# Patient Record
Sex: Male | Born: 1995 | Race: White | Marital: Single | Smoking: Never smoker
Health system: Southern US, Community
[De-identification: ages and names within clinical notes are randomized; demographics above are authoritative.]

---
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
## 2016-03-03 DIAGNOSIS — M25511 Pain in right shoulder: Secondary | ICD-10-CM | POA: Insufficient documentation

## 2016-03-28 HISTORY — PX: SHOULDER ARTHROSCOPY: SHX128

## 2016-07-22 DIAGNOSIS — M7521 Bicipital tendinitis, right shoulder: Secondary | ICD-10-CM | POA: Diagnosis not present

## 2016-07-22 DIAGNOSIS — S43431D Superior glenoid labrum lesion of right shoulder, subsequent encounter: Secondary | ICD-10-CM | POA: Diagnosis not present

## 2016-07-22 DIAGNOSIS — R531 Weakness: Secondary | ICD-10-CM | POA: Diagnosis not present

## 2016-07-22 DIAGNOSIS — M25611 Stiffness of right shoulder, not elsewhere classified: Secondary | ICD-10-CM | POA: Diagnosis not present

## 2016-07-27 DIAGNOSIS — R531 Weakness: Secondary | ICD-10-CM | POA: Diagnosis not present

## 2016-07-27 DIAGNOSIS — M7521 Bicipital tendinitis, right shoulder: Secondary | ICD-10-CM | POA: Diagnosis not present

## 2016-07-27 DIAGNOSIS — S43431D Superior glenoid labrum lesion of right shoulder, subsequent encounter: Secondary | ICD-10-CM | POA: Diagnosis not present

## 2016-07-27 DIAGNOSIS — M25611 Stiffness of right shoulder, not elsewhere classified: Secondary | ICD-10-CM | POA: Diagnosis not present

## 2016-08-17 DIAGNOSIS — M25611 Stiffness of right shoulder, not elsewhere classified: Secondary | ICD-10-CM | POA: Diagnosis not present

## 2016-08-17 DIAGNOSIS — S43431D Superior glenoid labrum lesion of right shoulder, subsequent encounter: Secondary | ICD-10-CM | POA: Diagnosis not present

## 2016-08-17 DIAGNOSIS — R531 Weakness: Secondary | ICD-10-CM | POA: Diagnosis not present

## 2016-08-17 DIAGNOSIS — M7521 Bicipital tendinitis, right shoulder: Secondary | ICD-10-CM | POA: Diagnosis not present

## 2016-08-18 DIAGNOSIS — M25611 Stiffness of right shoulder, not elsewhere classified: Secondary | ICD-10-CM | POA: Diagnosis not present

## 2016-08-18 DIAGNOSIS — S43431D Superior glenoid labrum lesion of right shoulder, subsequent encounter: Secondary | ICD-10-CM | POA: Diagnosis not present

## 2016-08-18 DIAGNOSIS — M7521 Bicipital tendinitis, right shoulder: Secondary | ICD-10-CM | POA: Diagnosis not present

## 2016-08-18 DIAGNOSIS — R531 Weakness: Secondary | ICD-10-CM | POA: Diagnosis not present

## 2016-09-15 DIAGNOSIS — M7521 Bicipital tendinitis, right shoulder: Secondary | ICD-10-CM | POA: Diagnosis not present

## 2016-09-15 DIAGNOSIS — R531 Weakness: Secondary | ICD-10-CM | POA: Diagnosis not present

## 2016-09-15 DIAGNOSIS — M25611 Stiffness of right shoulder, not elsewhere classified: Secondary | ICD-10-CM | POA: Diagnosis not present

## 2016-09-15 DIAGNOSIS — S43431D Superior glenoid labrum lesion of right shoulder, subsequent encounter: Secondary | ICD-10-CM | POA: Diagnosis not present

## 2016-10-06 DIAGNOSIS — S43431D Superior glenoid labrum lesion of right shoulder, subsequent encounter: Secondary | ICD-10-CM | POA: Diagnosis not present

## 2016-10-06 DIAGNOSIS — M7521 Bicipital tendinitis, right shoulder: Secondary | ICD-10-CM | POA: Diagnosis not present

## 2016-10-06 DIAGNOSIS — R531 Weakness: Secondary | ICD-10-CM | POA: Diagnosis not present

## 2016-10-06 DIAGNOSIS — M25611 Stiffness of right shoulder, not elsewhere classified: Secondary | ICD-10-CM | POA: Diagnosis not present

## 2016-10-11 DIAGNOSIS — M7521 Bicipital tendinitis, right shoulder: Secondary | ICD-10-CM | POA: Diagnosis not present

## 2016-10-11 DIAGNOSIS — M25611 Stiffness of right shoulder, not elsewhere classified: Secondary | ICD-10-CM | POA: Diagnosis not present

## 2016-10-11 DIAGNOSIS — S43431D Superior glenoid labrum lesion of right shoulder, subsequent encounter: Secondary | ICD-10-CM | POA: Diagnosis not present

## 2016-10-11 DIAGNOSIS — R531 Weakness: Secondary | ICD-10-CM | POA: Diagnosis not present

## 2016-10-25 DIAGNOSIS — R531 Weakness: Secondary | ICD-10-CM | POA: Diagnosis not present

## 2016-10-25 DIAGNOSIS — S43431D Superior glenoid labrum lesion of right shoulder, subsequent encounter: Secondary | ICD-10-CM | POA: Diagnosis not present

## 2016-10-25 DIAGNOSIS — M7521 Bicipital tendinitis, right shoulder: Secondary | ICD-10-CM | POA: Diagnosis not present

## 2016-10-25 DIAGNOSIS — M25611 Stiffness of right shoulder, not elsewhere classified: Secondary | ICD-10-CM | POA: Diagnosis not present

## 2016-10-27 DIAGNOSIS — R531 Weakness: Secondary | ICD-10-CM | POA: Diagnosis not present

## 2016-10-27 DIAGNOSIS — M7521 Bicipital tendinitis, right shoulder: Secondary | ICD-10-CM | POA: Diagnosis not present

## 2016-10-27 DIAGNOSIS — S43431D Superior glenoid labrum lesion of right shoulder, subsequent encounter: Secondary | ICD-10-CM | POA: Diagnosis not present

## 2016-10-27 DIAGNOSIS — M25611 Stiffness of right shoulder, not elsewhere classified: Secondary | ICD-10-CM | POA: Diagnosis not present

## 2016-11-01 DIAGNOSIS — R531 Weakness: Secondary | ICD-10-CM | POA: Diagnosis not present

## 2016-11-01 DIAGNOSIS — M25611 Stiffness of right shoulder, not elsewhere classified: Secondary | ICD-10-CM | POA: Diagnosis not present

## 2016-11-01 DIAGNOSIS — M7521 Bicipital tendinitis, right shoulder: Secondary | ICD-10-CM | POA: Diagnosis not present

## 2016-11-01 DIAGNOSIS — S43431D Superior glenoid labrum lesion of right shoulder, subsequent encounter: Secondary | ICD-10-CM | POA: Diagnosis not present

## 2016-11-02 ENCOUNTER — Ambulatory Visit: Payer: Self-pay

## 2016-11-02 ENCOUNTER — Ambulatory Visit (INDEPENDENT_AMBULATORY_CARE_PROVIDER_SITE_OTHER): Payer: BLUE CROSS/BLUE SHIELD | Admitting: Sports Medicine

## 2016-11-02 ENCOUNTER — Ambulatory Visit: Payer: Self-pay | Admitting: Sports Medicine

## 2016-11-02 VITALS — Ht 72.0 in | Wt 189.4 lb

## 2016-11-02 DIAGNOSIS — M79661 Pain in right lower leg: Secondary | ICD-10-CM | POA: Diagnosis not present

## 2016-11-02 DIAGNOSIS — M79662 Pain in left lower leg: Secondary | ICD-10-CM | POA: Diagnosis not present

## 2016-11-02 DIAGNOSIS — M79604 Pain in right leg: Secondary | ICD-10-CM | POA: Diagnosis not present

## 2016-11-02 DIAGNOSIS — M79605 Pain in left leg: Secondary | ICD-10-CM

## 2016-11-02 NOTE — Progress Notes (Signed)
OFFICE VISIT NOTE Brent Preston. Brent Preston [facility] at [facility] [phone]  Brent Preston - 21 y.o. male MRN [id number]  Date of birth: 10/24/1995  Visit Date: 11/02/2016  PCP: No primary care provider on file.   Referred by: No ref. provider found  Brent Preston. [name], [name] for Dr. Berline Chough.   SUBJECTIVE:   Chief Complaint  Patient presents with  . Bilateral leg pain   Left lower leg pain. Ranges from inferior gastroc to into foot/toes Started in HS. Used to be cramping/tightness. Currently there is now sensation loss with original issues. 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 Athlete has undergone ample running protocols previously with no issues  The pain is described as numbness/ sensation loss and painful. Patient describes it as "uncomfortable". and is rated as 4-6.  Worsened with activity. 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  Improves with n/a Therapies tried include compression, stretching, taping methods, myofascial release, cryotherapy, heat  Other associated symptoms include:    lower leg becomes "Stiff" and "rigid"  Otherwise ROS as it pertains to the Chief Complaint is as below:  He does report feelings of his foot is kind of flapping once he becomes fairly symptomatic.    Review of Systems  Constitutional: Negative for chills, fever, malaise/fatigue and weight loss.  Musculoskeletal: Positive for myalgias.  Skin: Positive for rash (red hue compared to right leg).    Otherwise per HPI.  HISTORY & PERTINENT PRIOR DATA:  No specialty comments available. He has no tobacco history on file. No results for input(s): HGBA1C, LABURIC in the last 8760 hours. Medications & Allergies reviewed per EMR Patient Active Problem List   Diagnosis Date Noted  . Right anterior shoulder pain 03/03/2016  . SLAP shoulder tear 09/25/2014  . Bilateral calf pain  06/05/2012   No past medical history on file. No family history on file. No past surgical history on file. Social History   Occupational History  . Not on file.   Social History Main Topics  . Smoking status: Not on file  . Smokeless tobacco: Not on file  . Alcohol use Not on file  . Drug use: Unknown  . Sexual activity: Not on file    OBJECTIVE:  VS:  HT:6' (182.9 cm)   WT:189 lb 6.4 oz (85.9 kg)  BMI:25.7    BP:   HR: bpm  TEMP: ( )  RESP:  Physical Exam Findings:  WDWN, NAD, Non-toxic appearing Alert & appropriately interactive Not depressed or anxious appearing No increased work of breathing. Pupils are equal. EOM intact without nystagmus No clubbing or cyanosis of the extremities appreciated No significant rashes/lesions/ulcerations overlying the examined area. DP & PT pulses 2+/4 both with pre-and post exertion.  No significant pretibial edema.  No clubbing or cyanosis Sensation intact to light touch in lower extremities. Dorsiflexion plantarflexion strength is 5 out of 5 as well as ankle eversion and inversion.  He has loss of his longitudinal arch with weightbearing right worse than left.  There is splay toe of his lateral column greater than medial bilateral.  He has early claw toe deformities.  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  ++++++++++++++++++++++++++++++++++++++++++++++++++++++++++++++++++ LIMITED MSK ULTRASOUND OF bilateral lateral compartments Images were obtained and interpreted by myself, Gaspar Bidding, DO  Images have been saved and stored to PACS system. Images obtained on: GE S7 Ultrasound machine  FINDINGS:  Preexercise testing revealed a small amount of neovascularity interstitially within the posterior compartment.  There is no significant tissue disruption.  Only minimal neovascularity at the fascial borders. Patient then proceeded to exercise to the point of symptoms on a stationary exercise bike as well as with  static repetitive jumping. Post exercise ultrasound revealed marked increased neovascularity within the interstitium as well as within the fascial borders of the deep and superficial posterior compartments.  He did have increased tissue turgor appreciated on palpation.  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    ASSESSMENT & PLAN:   Problem List Items Addressed This Visit    Bilateral calf pain    >50% of this 45 minute visit spent in direct patient counseling and/or coordination of care.  Discussion was focused on education regarding the in discussing the pathoetiology and anticipated clinical course of the above condition.  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  He had marked changes on musculoskeletal ultrasound today post exertion that are consistent and diagnostic of bilateral compartment syndrome.  We discussed multiple options for me given this is been an ongoing issue for him intermittently over the past several years consideration of bilateral posterior compartment releases should be considered.  Can consider exertional compartment pressure testing vs emperic release. Given the timing in the recovery this is something that he should consider sooner rather than later.  I encouraged him to have a discussion with Dr. Lajoyce Corners over the next several weeks and I am happy to share my findings with Dr. Lajoyce Corners in person if needed.   >50% of this 45 minute visit spent in direct patient counseling and/or coordination of care.  Discussion was focused on education regarding the in discussing the pathoetiology and anticipated clinical course of the above condition.  Discussion regarding potential operative intervention versus further  invasive testing discussed in great detail.  Additionally total face-to-face interaction of greater than 30 minutes given the exertional component of the testing  We also discussed the option for using compression as well as cushion orthotics to help with some symptomatic treatment in he will have these fabricated through the athletic training department.       Other Visit Diagnoses    Pain in both lower extremities    -  Primary   Relevant Orders   [place] LIMITED JOINT SPACE STRUCTURES LOW BILAT(NO LINKED CHARGES)      Follow-up: Return if symptoms worsen or fail to improve.   CMA/ATC served as Neurosurgeon during this visit. History, Physical, and Plan performed by medical provider. Documentation and orders reviewed and attested to.      Gaspar Bidding, DO    [facility]

## 2016-11-08 DIAGNOSIS — S43431D Superior glenoid labrum lesion of right shoulder, subsequent encounter: Secondary | ICD-10-CM | POA: Diagnosis not present

## 2016-11-08 DIAGNOSIS — M79A22 Nontraumatic compartment syndrome of left lower extremity: Secondary | ICD-10-CM | POA: Diagnosis not present

## 2016-11-17 DIAGNOSIS — S43431D Superior glenoid labrum lesion of right shoulder, subsequent encounter: Secondary | ICD-10-CM | POA: Diagnosis not present

## 2016-11-17 DIAGNOSIS — M79A22 Nontraumatic compartment syndrome of left lower extremity: Secondary | ICD-10-CM | POA: Diagnosis not present

## 2016-12-01 DIAGNOSIS — M79604 Pain in right leg: Secondary | ICD-10-CM | POA: Diagnosis not present

## 2016-12-01 DIAGNOSIS — M79A21 Nontraumatic compartment syndrome of right lower extremity: Secondary | ICD-10-CM | POA: Diagnosis not present

## 2016-12-01 DIAGNOSIS — M79605 Pain in left leg: Secondary | ICD-10-CM | POA: Diagnosis not present

## 2016-12-01 DIAGNOSIS — M79A22 Nontraumatic compartment syndrome of left lower extremity: Secondary | ICD-10-CM | POA: Diagnosis not present

## 2016-12-05 DIAGNOSIS — M79A22 Nontraumatic compartment syndrome of left lower extremity: Secondary | ICD-10-CM | POA: Diagnosis not present

## 2016-12-05 DIAGNOSIS — M79A21 Nontraumatic compartment syndrome of right lower extremity: Secondary | ICD-10-CM | POA: Diagnosis not present

## 2016-12-05 NOTE — Assessment & Plan Note (Addendum)
>  50% of this 45 minute visit spent in direct patient counseling and/or coordination of care.  Discussion was focused on education regarding the in discussing the pathoetiology and anticipated clinical course of the above condition.  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  He had marked changes on musculoskeletal ultrasound today post exertion that are consistent and diagnostic of bilateral compartment syndrome.  We discussed multiple options for me given this is been an ongoing issue for him intermittently over the past several years consideration of bilateral posterior compartment releases should be considered.  Can consider exertional compartment pressure testing vs emperic release. Given the timing in the recovery this is something that he should consider sooner rather than later.  I encouraged him to have a discussion with Dr. Lajoyce Cornersuda over the next several weeks and I am happy to share my findings with Dr. Lajoyce Cornersuda in person if needed.   >50% of this 45 minute visit spent in direct patient counseling and/or coordination of care.  Discussion was focused on education regarding the in discussing the pathoetiology and anticipated clinical course of the above condition.  Discussion regarding potential operative intervention versus further invasive testing discussed in great detail.  Additionally total face-to-face interaction of greater than 30 minutes given the exertional component of the testing  We also discussed the option for using compression as well as cushion orthotics to help with some symptomatic treatment in he will have these fabricated through the athletic training department.

## 2016-12-09 DIAGNOSIS — M79A22 Nontraumatic compartment syndrome of left lower extremity: Secondary | ICD-10-CM | POA: Diagnosis not present

## 2016-12-09 DIAGNOSIS — M79A21 Nontraumatic compartment syndrome of right lower extremity: Secondary | ICD-10-CM | POA: Diagnosis not present

## 2016-12-20 DIAGNOSIS — M79A22 Nontraumatic compartment syndrome of left lower extremity: Secondary | ICD-10-CM | POA: Diagnosis not present

## 2016-12-20 DIAGNOSIS — M79A21 Nontraumatic compartment syndrome of right lower extremity: Secondary | ICD-10-CM | POA: Diagnosis not present

## 2016-12-22 DIAGNOSIS — M79A22 Nontraumatic compartment syndrome of left lower extremity: Secondary | ICD-10-CM | POA: Diagnosis not present

## 2016-12-22 DIAGNOSIS — M79A21 Nontraumatic compartment syndrome of right lower extremity: Secondary | ICD-10-CM | POA: Diagnosis not present

## 2016-12-23 DIAGNOSIS — M25511 Pain in right shoulder: Secondary | ICD-10-CM | POA: Diagnosis not present

## 2016-12-26 DIAGNOSIS — M79A22 Nontraumatic compartment syndrome of left lower extremity: Secondary | ICD-10-CM | POA: Diagnosis not present

## 2016-12-26 DIAGNOSIS — M79A21 Nontraumatic compartment syndrome of right lower extremity: Secondary | ICD-10-CM | POA: Diagnosis not present

## 2016-12-27 DIAGNOSIS — M79A21 Nontraumatic compartment syndrome of right lower extremity: Secondary | ICD-10-CM | POA: Diagnosis not present

## 2016-12-27 DIAGNOSIS — M79A22 Nontraumatic compartment syndrome of left lower extremity: Secondary | ICD-10-CM | POA: Diagnosis not present

## 2016-12-30 DIAGNOSIS — M79A22 Nontraumatic compartment syndrome of left lower extremity: Secondary | ICD-10-CM | POA: Diagnosis not present

## 2016-12-30 DIAGNOSIS — M79A21 Nontraumatic compartment syndrome of right lower extremity: Secondary | ICD-10-CM | POA: Diagnosis not present

## 2017-01-02 DIAGNOSIS — M79A29 Nontraumatic compartment syndrome of unspecified lower extremity: Secondary | ICD-10-CM | POA: Insufficient documentation

## 2017-01-05 DIAGNOSIS — M79A22 Nontraumatic compartment syndrome of left lower extremity: Secondary | ICD-10-CM | POA: Diagnosis not present

## 2017-01-05 DIAGNOSIS — S43431D Superior glenoid labrum lesion of right shoulder, subsequent encounter: Secondary | ICD-10-CM | POA: Diagnosis not present

## 2017-01-12 DIAGNOSIS — M79A22 Nontraumatic compartment syndrome of left lower extremity: Secondary | ICD-10-CM | POA: Diagnosis not present

## 2017-01-12 DIAGNOSIS — S43431D Superior glenoid labrum lesion of right shoulder, subsequent encounter: Secondary | ICD-10-CM | POA: Diagnosis not present

## 2017-02-15 DIAGNOSIS — D2361 Other benign neoplasm of skin of right upper limb, including shoulder: Secondary | ICD-10-CM | POA: Diagnosis not present

## 2017-03-02 DIAGNOSIS — S43431D Superior glenoid labrum lesion of right shoulder, subsequent encounter: Secondary | ICD-10-CM | POA: Diagnosis not present

## 2017-03-02 DIAGNOSIS — M79A22 Nontraumatic compartment syndrome of left lower extremity: Secondary | ICD-10-CM | POA: Diagnosis not present

## 2017-05-01 DIAGNOSIS — S43431D Superior glenoid labrum lesion of right shoulder, subsequent encounter: Secondary | ICD-10-CM | POA: Diagnosis not present

## 2017-05-01 DIAGNOSIS — M79A22 Nontraumatic compartment syndrome of left lower extremity: Secondary | ICD-10-CM | POA: Diagnosis not present

## 2017-05-03 DIAGNOSIS — Z23 Encounter for immunization: Secondary | ICD-10-CM | POA: Diagnosis not present

## 2017-05-04 DIAGNOSIS — S43431D Superior glenoid labrum lesion of right shoulder, subsequent encounter: Secondary | ICD-10-CM | POA: Diagnosis not present

## 2017-05-04 DIAGNOSIS — M79A22 Nontraumatic compartment syndrome of left lower extremity: Secondary | ICD-10-CM | POA: Diagnosis not present

## 2017-05-05 ENCOUNTER — Ambulatory Visit: Payer: BLUE CROSS/BLUE SHIELD | Admitting: Sports Medicine

## 2017-05-05 ENCOUNTER — Ambulatory Visit (INDEPENDENT_AMBULATORY_CARE_PROVIDER_SITE_OTHER): Payer: BLUE CROSS/BLUE SHIELD | Admitting: Sports Medicine

## 2017-05-05 ENCOUNTER — Encounter: Payer: Self-pay | Admitting: Sports Medicine

## 2017-05-05 ENCOUNTER — Ambulatory Visit: Payer: Self-pay

## 2017-05-05 VITALS — BP 112/70 | HR 76 | Ht 71.0 in | Wt 198.2 lb

## 2017-05-05 DIAGNOSIS — M25511 Pain in right shoulder: Secondary | ICD-10-CM

## 2017-05-05 DIAGNOSIS — S43431S Superior glenoid labrum lesion of right shoulder, sequela: Secondary | ICD-10-CM

## 2017-05-05 NOTE — Progress Notes (Signed)
OFFICE VISIT NOTE Brent Preston. Brent Preston Sports Medicine [facility] at [facility] [phone]  Brent Preston - 21 y.o. male MRN [id number]  Date of birth: 1995/10/29  Visit Date: 05/05/2017  PCP: No primary care provider on file.   Referred by: No ref. provider found  Brent Preston PT, LAT, [name] for Dr. Berline Chough.  SUBJECTIVE:   Chief Complaint  Patient presents with  . Follow-up    R shoulder pain   HPI: As below and per problem based documentation when appropriate.  Brent Preston is an established pt presenting today w/ c/o R shoulder pain.  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  Pt states that his R shoulder is currently bothering him in the R post shoulder x 1.5 weeks.  He recalls no specific MOI but reports his R shoulder feeling sore after a practice and then felt worse the next morning.  Pt states that he has been out of basketball activity for about a week and notes that all OH lifting is painful.  He has still been doing some upper body lifting but lighter weight and below shoulder level.  He reports the pain to be an aching pain and rates it as a 5/10.  Pt states that he has been doing isometrics, RC strengthening, cupping, e-stim w/ ice and heat but has noticed no change in his symptoms.  He states that he has seen Brent Preston 2x and has been working w/ his team AT at [employer].      Review of Systems  Constitutional: Negative for chills, fever and weight loss.  HENT: Negative.   Eyes: Negative.   Respiratory: Negative for cough, shortness of breath and wheezing.   Cardiovascular: Negative for chest pain and palpitations.  Gastrointestinal: Negative for abdominal pain, heartburn and nausea.  Musculoskeletal: Positive for joint pain. Negative for falls.  Neurological: Negative for dizziness, tingling and headaches.  Endo/Heme/Allergies: Does not bruise/bleed easily.  Psychiatric/Behavioral:  Negative for depression. The patient is not nervous/anxious and does not have insomnia.     Otherwise per HPI.  HISTORY & PERTINENT PRIOR DATA:  No specialty comments available. He reports that he has never smoked. He has never used smokeless tobacco. No results for input(s): HGBA1C, LABURIC in the last 8760 hours. Allergies reviewed per EMR Prior to Admission medications   Not on File   Patient Active Problem List   Diagnosis Date Noted  . Right anterior shoulder pain 03/03/2016  . SLAP shoulder tear 09/25/2014  . Bilateral calf pain 06/05/2012   No past medical history on file. No family history on file. No past surgical history on file. Social History   Occupational History  . Not on file.   Social History Main Topics  . Smoking status: Never Smoker  . Smokeless tobacco: Never Used  . Alcohol use Not on file  . Drug use: Unknown  . Sexual activity: Not on file    OBJECTIVE:  VS:  HT:5\' 11"  (180.3 cm)   WT:198 lb 3.2 oz (89.9 kg)  BMI:27.66    BP:112/70  HR:76bpm  TEMP: ( )  RESP:97 % EXAM: Findings:  WDWN, NAD, Non-toxic appearing Alert & appropriately interactive Not depressed or anxious appearing No increased work of breathing. Pupils are equal. EOM intact without nystagmus No clubbing or cyanosis of the extremities appreciated No significant rashes/lesions/ulcerations overlying the examined area. Radial pulses 2+/4.  No significant generalized  UE edema. Sensation intact to light touch in upper extremities.   RIGHT Shoulder Exam: Normal alignment, Normal Contours No overlying erythema/ecchymosis. No pain or crepitation with axial loading and circumduction TTP over: Anterior deltopectoral groove, subscap trigger points No TTP over: Bony landmarks, posterior shoulder Internal Rotation: Normal External Rotation: Normal Empty can: Normal Hawkins: Normal Neers: Normal Speeds:Normal O'Brien's: Normal     RADIOLOGY: US LIMITED JOINT SPACE STRUCTURES  UP RIGHT(NO LINKED CHARGES) Andrena MewsRigby, Michael D, DO     05/10/2017 12:22 AM PROCEDURE NOTE - ULTRASOUND GUIDED INJECTION: Right Shoulder Images were obtained and interpreted by myself, Gaspar BiddingMichael Rigby, DO   Images have been saved and stored to PACS system. Images obtained on: GE S7 Ultrasound machine  ULTRASOUND FINDINGS:  Biceps Tendon: Prior tenodesis, normal postsurgical changes Pec Major Insertion: Normal Subscapularis Tendon: Normal, with impingement  Supraspinatus Tendon: Normal tendon appearance with impingement  dynamically Infraspinatus/Teres Minor Tendon: Normal AC Joint: Normal JOINT: No significant GH spurring appreciated LABRUM: Possible postsurgical changes, unable to rule out  recurrent tear  DESCRIPTION OF PROCEDURE:  The patient's clinical condition is marked by substantial pain  and/or significant functional disability. Other conservative  therapy has not provided relief, is contraindicated, or not  appropriate. There is a reasonable likelihood that injection will  significantly improve the patient's pain and/or functional  impairment. After discussing the risks, benefits and expected  outcomes of the injection and all questions were reviewed and  answered, the patient wished to undergo the above named  procedure. Verbal consent was obtained. The ultrasound was used  to identify the target structure and adjacent neurovascular  structures. The skin was then prepped in sterile fashion and the  target structure was injected under direct visualization using  sterile technique as below: PREP: Alcohol, Ethel Chloride APPROACH: Lateral, subacromial at the area of anterior  impingement, single injection, 21g 2" needle,  INJECTATE: 2 cc 0.5% marcaine, 2 cc 40mg  DepoMedrol ASPIRATE: N/A DRESSING: Band-Aid  Post procedural instructions including recommending icing and  warning signs for infection were reviewed. This procedure was  well tolerated and there were no  complications.   IMPRESSION: Succesful US Guided Injection  ASSESSMENT & PLAN:     ICD-10-CM   1. Acute pain of right shoulder M25.511 US LIMITED JOINT SPACE STRUCTURES UP RIGHT(NO LINKED CHARGES)  2. 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 Right anterior shoulder pain M25.511    ================================================================= Right anterior shoulder pain Evidence of subacromial and subdeltoid impingement injected today.  There is some adhesive changes improved following injection.  Slight return to activities in the next 2 weeks.  If any lack of improvement will need MRI arthrogram.  PROCEDURE NOTE - ULTRASOUND GUIDED INJECTION: Right Shoulder Images were obtained and interpreted by myself, Gaspar BiddingMichael Rigby, DO  Images have been saved and stored to PACS system. Images obtained on: GE S7 Ultrasound machine  ULTRASOUND FINDINGS:  Biceps Tendon: Prior tenodesis, normal postsurgical changes Pec Major Insertion: Normal Subscapularis Tendon: Normal, with impingement  Supraspinatus Tendon: Normal tendon appearance with impingement dynamically Infraspinatus/Teres Minor Tendon: Normal AC Joint: Normal JOINT: No significant GH spurring appreciated LABRUM: Possible postsurgical changes, unable to rule out recurrent tear   DESCRIPTION OF PROCEDURE:  The patient's clinical condition is marked by substantial pain and/or significant functional disability. Other conservative therapy has not provided relief, is contraindicated, or not appropriate. There is a reasonable likelihood that injection will significantly improve the patient's pain and/or functional impairment. After discussing the risks, benefits  and expected outcomes of the injection and all questions were reviewed and answered, the patient wished to undergo the above named procedure. Verbal consent was obtained. The ultrasound was used to identify the target structure and adjacent  neurovascular structures. The skin was then prepped in sterile fashion and the target structure was injected under direct visualization using sterile technique as below: PREP: Alcohol, Ethel Chloride APPROACH: Lateral, subacromial at the area of anterior impingement, single injection, 21g 2" needle,  INJECTATE: 2 cc 0.5% marcaine, 2 cc 40mg  DepoMedrol ASPIRATE: N/A DRESSING: Band-Aid  Post procedural instructions including recommending icing and warning signs for infection were reviewed. This procedure was well tolerated and there were no complications.   IMPRESSION: Succesful US Guided Injection   ================================================================= Patient Instructions  You had an injection today.  Things to be aware of after injection are listed below: . You may experience no significant improvement or even a slight worsening in your symptoms during the first 24 to 48 hours.  After that we expect your symptoms to improve gradually over the next 2 weeks for the medicine to have its maximal effect.  You should continue to have improvement out to 6 weeks after your injection. . Dr. Berline Chough recommends icing the site of the injection for 20 minutes  1-2 times the day of your injection . You may shower but no swimming, tub bath or Jacuzzi for 24 hours. . If your bandage falls off this does not need to be replaced.  It is appropriate to remove the bandage after 4 hours. . You may resume light activities as tolerated unless otherwise directed per Dr. Berline Chough during your visit  POSSIBLE STEROID SIDE EFFECTS:  Side effects from injectable steroids tend to be less than when taken orally however you may experience some of the symptoms listed below.  If experienced these should only last for a short period of time. Change in menstrual flow  Edema (swelling)  Increased appetite Skin flushing (redness)  Skin rash/acne  Thrush (oral) Yeast vaginitis    Increased sweating  Depression Increased  blood glucose levels Cramping and leg/calf  Euphoria (feeling happy)  POSSIBLE PROCEDURE SIDE EFFECTS: The side effects of the injection are usually fairly minimal however if you may experience some of the following side effects that are usually self-limited and will is off on their own.  If you are concerned please feel free to call the office with questions:  Increased numbness or tingling  Nausea or vomiting  Swelling or bruising at the injection site   Please call our office if if you experience any of the following symptoms over the next week as these can be signs of infection:   Fever greater than 100.26F  Significant swelling at the injection site  Significant redness or drainage from the injection site  If after 2 weeks you are continuing to have worsening symptoms please call our office to discuss what the next appropriate actions should be including the potential for a return office visit or other diagnostic testing.     ================================================================= No future appointments.  Follow-up: Return in about 2 weeks (around 05/19/2017) for f/u in the [facility].   CMA/ATC served as Neurosurgeon during this visit. History, Physical, and Plan performed by medical provider. Documentation and orders reviewed and attested to.      Gaspar Bidding, DO    [facility]

## 2017-05-05 NOTE — Patient Instructions (Signed)

## 2017-05-10 NOTE — Procedures (Signed)
PROCEDURE NOTE - ULTRASOUND GUIDED INJECTION: Right Shoulder Images were obtained and interpreted by myself, Gaspar BiddingMichael Jahlisa Rossitto, DO  Images have been saved and stored to PACS system. Images obtained on: GE S7 Ultrasound machine  ULTRASOUND FINDINGS:  Biceps Tendon: Prior tenodesis, normal postsurgical changes Pec Major Insertion: Normal Subscapularis Tendon: Normal, with impingement  Supraspinatus Tendon: Normal tendon appearance with impingement dynamically Infraspinatus/Teres Minor Tendon: Normal AC Joint: Normal JOINT: No significant GH spurring appreciated LABRUM: Possible postsurgical changes, unable to rule out recurrent tear   DESCRIPTION OF PROCEDURE:  The patient's clinical condition is marked by substantial pain and/or significant functional disability. Other conservative therapy has not provided relief, is contraindicated, or not appropriate. There is a reasonable likelihood that injection will significantly improve the patient's pain and/or functional impairment. After discussing the risks, benefits and expected outcomes of the injection and all questions were reviewed and answered, the patient wished to undergo the above named procedure. Verbal consent was obtained. The ultrasound was used to identify the target structure and adjacent neurovascular structures. The skin was then prepped in sterile fashion and the target structure was injected under direct visualization using sterile technique as below: PREP: Alcohol, Ethel Chloride APPROACH: Lateral, subacromial at the area of anterior impingement, single injection, 21g 2" needle,  INJECTATE: 2 cc 0.5% marcaine, 2 cc 40mg  DepoMedrol ASPIRATE: N/A DRESSING: Band-Aid  Post procedural instructions including recommending icing and warning signs for infection were reviewed. This procedure was well tolerated and there were no complications.   IMPRESSION: Succesful US Guided Injection

## 2017-05-10 NOTE — Assessment & Plan Note (Signed)
Evidence of subacromial and subdeltoid impingement injected today.  There is some adhesive changes improved following injection.  Slight return to activities in the next 2 weeks.  If any lack of improvement will need MRI arthrogram.

## 2017-05-11 DIAGNOSIS — S43431D Superior glenoid labrum lesion of right shoulder, subsequent encounter: Secondary | ICD-10-CM | POA: Diagnosis not present

## 2017-05-11 DIAGNOSIS — M79A22 Nontraumatic compartment syndrome of left lower extremity: Secondary | ICD-10-CM | POA: Diagnosis not present

## 2017-06-06 ENCOUNTER — Ambulatory Visit (INDEPENDENT_AMBULATORY_CARE_PROVIDER_SITE_OTHER): Payer: BLUE CROSS/BLUE SHIELD | Admitting: Sports Medicine

## 2017-06-06 ENCOUNTER — Encounter: Payer: Self-pay | Admitting: Sports Medicine

## 2017-06-06 VITALS — BP 128/86 | HR 71 | Ht 71.0 in | Wt 200.2 lb

## 2017-06-06 DIAGNOSIS — M25511 Pain in right shoulder: Secondary | ICD-10-CM | POA: Diagnosis not present

## 2017-06-06 DIAGNOSIS — S43431S Superior glenoid labrum lesion of right shoulder, sequela: Secondary | ICD-10-CM

## 2017-06-06 NOTE — Patient Instructions (Signed)

## 2017-06-06 NOTE — Progress Notes (Signed)
OFFICE VISIT NOTE Veverly FellsMichael D. Delorise Shinerigby, DO  [facility] [place] [facility] [phone]  Brent RodneyJack Lanpher - 21 y.o. male MRN [id number]  Date of birth: 12-May-1996  Visit Date: 06/06/2017  PCP: Patient, No Pcp Per   Referred by: No ref. provider found  Fabio PierceMolly A Weber PT, LAT, [name] for Dr. Berline Choughigby.  SUBJECTIVE:   Chief Complaint  Patient presents with  . Follow-up    R shoulder pain   HPI: As below and per problem based documentation when appropriate.  Brent Preston is an established pt presenting today for f/u of his R shoulder pain.  Pt was last seen on 05/05/17 and had a R shoulder injection.  He notes that he's had no change in his symptoms since the injection.  Pt states that he hasn't practiced at all since his last visit.  He states that he's [facility] and PT w/ Ellamae SiaJohn O'halloran and, again, notes no improvement.  Pt states that he has an uncomfortable pain in his R shoulder even at rest but pain increases w/ R shoulder movement.  He states that that he's had somewhere between 6-8 injections in his R shoulder over the course of several years and notes that none of them have helped.    Review of Systems  Constitutional: Negative for chills and fever.  HENT: Negative.   Eyes: Negative.   Respiratory: Negative for cough, shortness of breath and wheezing.   Cardiovascular: Negative for chest pain and palpitations.  Gastrointestinal: Negative for abdominal pain, heartburn and nausea.  Musculoskeletal: Positive for joint pain. Negative for falls.  Neurological: Negative for dizziness, tingling and headaches.  Endo/Heme/Allergies: Does not bruise/bleed easily.    Otherwise per HPI.   HISTORY & PERTINENT PRIOR DATA:  Prior History reviewed and updated per electronic medical record. Significant history, findings, studies and interim changes include: No additional findings.  reports that  has never smoked. he has  never used smokeless tobacco. No results for input(s): HGBA1C, LABURIC, CREATINE in the last 8760 hours. Problem  Slap Shoulder Tear   10/24/2014: SLAP repair 07/31/2015 arthroscopic debridement 03/28/2016: Shoulder arthroscopy with CAPSULORRHAPHY, SHOULDER SYNOVECTOMY (PARTIAL), TENODESIS OF LONG TENDON OF BICEPS; Surgeon: Dallie Pileslaude Thurman Moorman III, MD;  MR arthrogram on 06/19/2017 shows interval biceps tenodesis and labral repair with no overt recurrent tearing of the labrum.  Effectively normal MRI.  Having persistent mechanical symptoms.     OBJECTIVE:  VS:  HT:5\' 11"  (180.3 cm)   WT:200 lb 3.2 oz (90.8 kg)  BMI:27.93    BP:128/86  HR:71bpm  TEMP: ( )  RESP:96 %  PHYSICAL EXAM: Constitutional: WDWN, Non-toxic appearing. Psychiatric: Alert & appropriately interactive. Not depressed or anxious appearing. Respiratory: No increased work of breathing. Trachea Midline Eyes: Pupils are equal. EOM intact without nystagmus. No scleral icterus  Right shoulder is overall well aligned.  He has limited overhead range of motion by approximately 10-15 degrees.  He has crepitation with axial load and circumduction that is difficult to localize but seems to be intra-articular.  No significant pain over the [facility].  Pain with empty can testing and strength is 5 out of 5.  Pain is worse with O'Brien's testing.    ASSESSMENT & PLAN:   1. Acute pain of right shoulder   2. 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 Right anterior shoulder pain    Plan: Given the persistent ongoing symptoms repeat MR  arthrogram indicated at this time.  We will plan to follow-up with him after this is obtained in the athletic training room.   ++++++++++++++++++++++++++++++++++++++++++++ Follow-up: Return if symptoms worsen or fail to improve, for in the [facility].   Pertinent documentation may be included in additional procedure notes, imaging studies, problem based  documentation and patient instructions. Please see these sections of the encounter for additional information regarding this visit. CMA/ATC served as Neurosurgeonscribe during this visit. History, Physical, and Plan performed by medical provider. Documentation and orders reviewed and attested to.      Andrena MewsMichael D Tannen Vandezande, DO    [facility]

## 2017-06-12 ENCOUNTER — Institutional Professional Consult (permissible substitution): Payer: BLUE CROSS/BLUE SHIELD | Admitting: Sports Medicine

## 2017-06-12 ENCOUNTER — Other Ambulatory Visit: Payer: Self-pay

## 2017-06-12 DIAGNOSIS — M25511 Pain in right shoulder: Secondary | ICD-10-CM

## 2017-06-19 ENCOUNTER — Ambulatory Visit (INDEPENDENT_AMBULATORY_CARE_PROVIDER_SITE_OTHER): Payer: BLUE CROSS/BLUE SHIELD

## 2017-06-19 ENCOUNTER — Ambulatory Visit (INDEPENDENT_AMBULATORY_CARE_PROVIDER_SITE_OTHER): Payer: BLUE CROSS/BLUE SHIELD | Admitting: Sports Medicine

## 2017-06-19 DIAGNOSIS — M25511 Pain in right shoulder: Secondary | ICD-10-CM

## 2017-06-19 MED ORDER — GADOBENATE DIMEGLUMINE 529 MG/ML IV SOLN
1.0000 mL | Freq: Once | INTRAVENOUS | Status: AC | PRN
  Administered 2017-06-19: 1 mL via INTRAVENOUS

## 2017-06-19 NOTE — Progress Notes (Signed)
   Procedure: Real-time Ultrasound Guided gadolinium contrast injection of right glenohumeral joint Device: GE Logiq E  Verbal informed consent obtained.  Time-out conducted.  Noted no overlying erythema, induration, or other signs of local infection.  Skin prepped in a sterile fashion.  Local anesthesia: Topical Ethyl chloride.  With sterile technique and under real time ultrasound guidance: Using a 22-gauge spinal needle advanced into the glenohumeral joint taking care to avoid the posterior labrum, I then injected 1 cc kenalog 40, 2 cc lidocaine, 2 cc bupivacaine, syringe again switched and I then injected 0.1 cc gadolinium, syringe switched again and 10 cc sterile saline used to flush the needle. Joint visualized and capsule seen distending confirming intra-articular placement of contrast material and medication. Completed without difficulty  Advised to call if fevers/chills, erythema, induration, drainage, or persistent bleeding.  Images permanently stored and available for review in the ultrasound unit.  Impression: Technically successful ultrasound guided gadolinium contrast injection for MR arthrography.  Please see separate MR arthrogram report.

## 2017-06-19 NOTE — Assessment & Plan Note (Signed)
Right glenohumeral gadolinium injection for MR arthrogram as above, keep further follow-up with Dr. Gaspar BiddingMichael Rigby the ordering physician.

## 2017-06-20 ENCOUNTER — Encounter: Payer: Self-pay | Admitting: Sports Medicine

## 2017-06-27 DIAGNOSIS — M79A22 Nontraumatic compartment syndrome of left lower extremity: Secondary | ICD-10-CM | POA: Diagnosis not present

## 2017-06-27 DIAGNOSIS — S43431D Superior glenoid labrum lesion of right shoulder, subsequent encounter: Secondary | ICD-10-CM | POA: Diagnosis not present

## 2017-06-29 ENCOUNTER — Encounter: Payer: Self-pay | Admitting: Sports Medicine

## 2017-06-30 ENCOUNTER — Ambulatory Visit (INDEPENDENT_AMBULATORY_CARE_PROVIDER_SITE_OTHER): Payer: BLUE CROSS/BLUE SHIELD | Admitting: Sports Medicine

## 2017-06-30 ENCOUNTER — Encounter: Payer: Self-pay | Admitting: Sports Medicine

## 2017-06-30 ENCOUNTER — Ambulatory Visit: Payer: Self-pay

## 2017-06-30 DIAGNOSIS — M25511 Pain in right shoulder: Secondary | ICD-10-CM | POA: Diagnosis not present

## 2017-06-30 NOTE — Procedures (Signed)
PROCEDURE NOTE -  ULTRASOUND GUIDEDInjection: Right shoulder Images were obtained and interpreted by myself, Gaspar BiddingMichael Rigby, DO  Images have been saved and stored to PACS system. Images obtained on: GE S7 Ultrasound machine  ULTRASOUND FINDINGS:  No significant effusion, fraying of the posterior labrum with postsurgical changes that are evident  DESCRIPTION OF PROCEDURE:  The patient's clinical condition is marked by substantial pain and/or significant functional disability. Other conservative therapy has not provided relief, is contraindicated, or not appropriate. There is a reasonable likelihood that injection will significantly improve the patient's pain and/or functional impairment.  After discussing the risks, benefits and expected outcomes of the injection and all questions were reviewed and answered, the patient wished to undergo the above named procedure. Verbal consent was obtained.  The ultrasound was used to identify the target structure and adjacent neurovascular structures. The skin was then prepped in sterile fashion and the target structure was injected under direct visualization using sterile technique as below:  Right PREP: Alcohol, Ethel Chloride,  APPROACH: Posterior, stopcock technique, 22g 3.5in. INJECTATE: 3cc 1% lidocaine, 2cc 0.5% marcaine, 1cc 40mg /mL DepoMedrol, 1 cc of 40 mg/mL Kenalog ASPIRATE: N/A DRESSING: Band-Aid    Post procedural instructions including recommending icing and warning signs for infection were reviewed.  This procedure was well tolerated and there were no complications.   IMPRESSION: Succesful US Guided Injection

## 2017-06-30 NOTE — Progress Notes (Signed)
  [facility] Training Room Note Brent Preston. Rigby, [facility] [place]  Gamble Enderle - 21 y.o. male MRN [id number]  Date of birth: 04/25/1996  Visit Date: 06/29/2017  PCP: Patient, No Pcp Per   Referred by: No ref. provider found   Met with Brent Preston today in the athletic training room and reviewed the results of his MRI.  Given the overall reassuring findings and persistent ongoing symptoms especially with posterior capsule stressing I would like to try an intra-articular injection as he has not had 1 of these performed in quite some time.  If any lack of improvement with this could consider diagnostic arthroscopy but likely low yield from this at this time.  We did discuss that the goal of further therapy is to help him normalize his day-to-day activities and that the likelihood of him returning to full basketball is low and he is aware of this.  We will plan to follow-up with him in clinic tomorrow for an injection.      Gerda Diss, [facility]

## 2017-06-30 NOTE — Progress Notes (Signed)
  Veverly FellsMichael D. Delorise Shinerigby, DO  [facility] [place] [phone]  Brent Preston - 21 y.o. male MRN [id number]  Date of birth: 1996/03/12  Visit Date: 06/30/2017  PCP: Patient, No Pcp Per   Referred by: No ref. provider found   Scribe for today's visit: Christoper FabianMolly Amity Roes, ATC    Brent Preston is here for Follow-up (R shoulder pain) .    Brent RodneyJack Preston is an established pt presenting today for f/u of his R shoulder pain.  Pt was last seen on 05/05/17 and had a R shoulder injection.  He notes that he's had no change in his symptoms since the injection.  Pt states that he hasn't practiced at all since his last visit.  He states that he's [facility] and PT w/ Ellamae SiaJohn Preston and, again, notes no improvement.  Pt states that he has an uncomfortable pain in his R shoulder even at rest but pain increases w/ R shoulder movement.  He states that that he's had somewhere between 6-8 injections in his R shoulder over the course of several years and notes that none of them have helped.  [facility] decision to undergo intra-articular injection today.   ROS Denies night time disturbances.  But does have a history of this intermittently waking him up. Denies fevers, chills, or night sweats. Denies unexplained weight loss.  Patient shoulder is overall normally aligned.  He is a well-developed deltoid muscle.  No significant overlying skin changes.  Ultrasound injection performed today per procedure note.  Follow-up in athletic training room 2 weeks

## 2017-07-03 DIAGNOSIS — M79A22 Nontraumatic compartment syndrome of left lower extremity: Secondary | ICD-10-CM | POA: Diagnosis not present

## 2017-07-03 DIAGNOSIS — S43431D Superior glenoid labrum lesion of right shoulder, subsequent encounter: Secondary | ICD-10-CM | POA: Diagnosis not present

## 2017-07-05 DIAGNOSIS — S43431D Superior glenoid labrum lesion of right shoulder, subsequent encounter: Secondary | ICD-10-CM | POA: Diagnosis not present

## 2017-07-05 DIAGNOSIS — M79A22 Nontraumatic compartment syndrome of left lower extremity: Secondary | ICD-10-CM | POA: Diagnosis not present

## 2017-08-03 DIAGNOSIS — M25511 Pain in right shoulder: Secondary | ICD-10-CM | POA: Diagnosis not present

## 2017-08-20 DIAGNOSIS — J069 Acute upper respiratory infection, unspecified: Secondary | ICD-10-CM | POA: Diagnosis not present

## 2017-08-22 DIAGNOSIS — M25511 Pain in right shoulder: Secondary | ICD-10-CM | POA: Diagnosis not present

## 2017-08-22 DIAGNOSIS — S43491A Other sprain of right shoulder joint, initial encounter: Secondary | ICD-10-CM | POA: Diagnosis not present

## 2017-08-22 DIAGNOSIS — M25311 Other instability, right shoulder: Secondary | ICD-10-CM | POA: Diagnosis not present

## 2017-08-22 DIAGNOSIS — S43431A Superior glenoid labrum lesion of right shoulder, initial encounter: Secondary | ICD-10-CM | POA: Diagnosis not present

## 2017-08-22 DIAGNOSIS — G8918 Other acute postprocedural pain: Secondary | ICD-10-CM | POA: Diagnosis not present

## 2017-09-04 DIAGNOSIS — S43431D Superior glenoid labrum lesion of right shoulder, subsequent encounter: Secondary | ICD-10-CM | POA: Diagnosis not present

## 2017-09-04 DIAGNOSIS — M79A22 Nontraumatic compartment syndrome of left lower extremity: Secondary | ICD-10-CM | POA: Diagnosis not present

## 2017-09-07 DIAGNOSIS — M79A22 Nontraumatic compartment syndrome of left lower extremity: Secondary | ICD-10-CM | POA: Diagnosis not present

## 2017-09-07 DIAGNOSIS — S43431D Superior glenoid labrum lesion of right shoulder, subsequent encounter: Secondary | ICD-10-CM | POA: Diagnosis not present

## 2017-09-11 DIAGNOSIS — S43431D Superior glenoid labrum lesion of right shoulder, subsequent encounter: Secondary | ICD-10-CM | POA: Diagnosis not present

## 2017-09-11 DIAGNOSIS — M79A22 Nontraumatic compartment syndrome of left lower extremity: Secondary | ICD-10-CM | POA: Diagnosis not present

## 2017-09-18 DIAGNOSIS — S43431D Superior glenoid labrum lesion of right shoulder, subsequent encounter: Secondary | ICD-10-CM | POA: Diagnosis not present

## 2017-09-18 DIAGNOSIS — M79A22 Nontraumatic compartment syndrome of left lower extremity: Secondary | ICD-10-CM | POA: Diagnosis not present

## 2017-09-21 DIAGNOSIS — S43431D Superior glenoid labrum lesion of right shoulder, subsequent encounter: Secondary | ICD-10-CM | POA: Diagnosis not present

## 2017-09-21 DIAGNOSIS — M79A22 Nontraumatic compartment syndrome of left lower extremity: Secondary | ICD-10-CM | POA: Diagnosis not present

## 2017-09-27 DIAGNOSIS — M79A22 Nontraumatic compartment syndrome of left lower extremity: Secondary | ICD-10-CM | POA: Diagnosis not present

## 2017-09-27 DIAGNOSIS — S43431D Superior glenoid labrum lesion of right shoulder, subsequent encounter: Secondary | ICD-10-CM | POA: Diagnosis not present

## 2017-10-02 DIAGNOSIS — M79A22 Nontraumatic compartment syndrome of left lower extremity: Secondary | ICD-10-CM | POA: Diagnosis not present

## 2017-10-02 DIAGNOSIS — S43431D Superior glenoid labrum lesion of right shoulder, subsequent encounter: Secondary | ICD-10-CM | POA: Diagnosis not present

## 2017-10-04 DIAGNOSIS — S43431D Superior glenoid labrum lesion of right shoulder, subsequent encounter: Secondary | ICD-10-CM | POA: Diagnosis not present

## 2017-10-04 DIAGNOSIS — M79A22 Nontraumatic compartment syndrome of left lower extremity: Secondary | ICD-10-CM | POA: Diagnosis not present

## 2017-10-19 DIAGNOSIS — M79A22 Nontraumatic compartment syndrome of left lower extremity: Secondary | ICD-10-CM | POA: Diagnosis not present

## 2017-10-19 DIAGNOSIS — S43431D Superior glenoid labrum lesion of right shoulder, subsequent encounter: Secondary | ICD-10-CM | POA: Diagnosis not present

## 2017-10-24 DIAGNOSIS — M79A22 Nontraumatic compartment syndrome of left lower extremity: Secondary | ICD-10-CM | POA: Diagnosis not present

## 2017-10-24 DIAGNOSIS — S43431D Superior glenoid labrum lesion of right shoulder, subsequent encounter: Secondary | ICD-10-CM | POA: Diagnosis not present

## 2017-10-31 DIAGNOSIS — M79A22 Nontraumatic compartment syndrome of left lower extremity: Secondary | ICD-10-CM | POA: Diagnosis not present

## 2017-10-31 DIAGNOSIS — S43431D Superior glenoid labrum lesion of right shoulder, subsequent encounter: Secondary | ICD-10-CM | POA: Diagnosis not present

## 2017-11-08 DIAGNOSIS — S43431D Superior glenoid labrum lesion of right shoulder, subsequent encounter: Secondary | ICD-10-CM | POA: Diagnosis not present

## 2017-11-08 DIAGNOSIS — M79A22 Nontraumatic compartment syndrome of left lower extremity: Secondary | ICD-10-CM | POA: Diagnosis not present

## 2017-11-14 DIAGNOSIS — S43431D Superior glenoid labrum lesion of right shoulder, subsequent encounter: Secondary | ICD-10-CM | POA: Diagnosis not present

## 2017-11-14 DIAGNOSIS — M79A22 Nontraumatic compartment syndrome of left lower extremity: Secondary | ICD-10-CM | POA: Diagnosis not present

## 2017-11-20 DIAGNOSIS — S43431D Superior glenoid labrum lesion of right shoulder, subsequent encounter: Secondary | ICD-10-CM | POA: Diagnosis not present

## 2017-11-20 DIAGNOSIS — M79A22 Nontraumatic compartment syndrome of left lower extremity: Secondary | ICD-10-CM | POA: Diagnosis not present

## 2017-12-28 IMAGING — MR MR SHOULDER*R* W/CM
6 series · 40 of 40 positions shown · IV contrast (agent unspecified)
Comparison: MR arthrogram right shoulder 05/14/2015.

CLINICAL DATA: Right shoulder injury playing basketball 1 month
ago. History of prior right shoulder surgery x3. Pain and decreased
range of motion.

EXAM:
MR ARTHROGRAM OF THE RIGHT SHOULDER
TECHNIQUE: Multiplanar, multisequence MR imaging of the right shoulder was
performed following the administration of intra-articular contrast.
CONTRAST:  See Injection Documentation.

[Series 3: T1 fat-sat · axial · 4.0mm · 0.55mm/px · z∈[-30,+62]mm · 6 of 22 slices shown (1 of 4)]
[im 1/22]
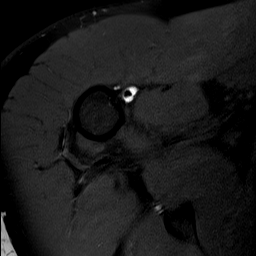
[im 5/22]
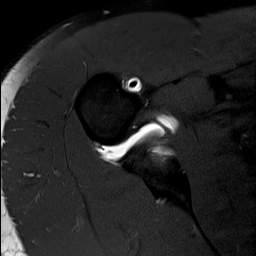
[im 9/22]
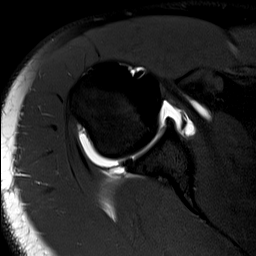
[im 13/22]
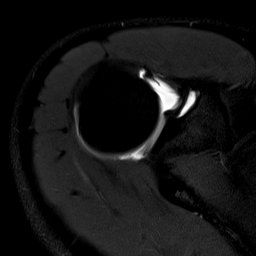
[im 17/22]
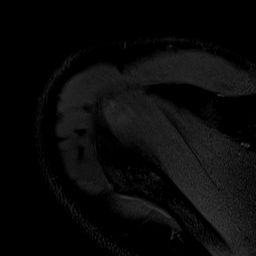
[im 22/22]
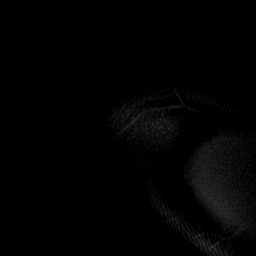

[Series 4: T1 fat-sat · oblique · 4.0mm · 0.59mm/px · 7 of 21 slices shown (2 of 4)]
[im 1/21]
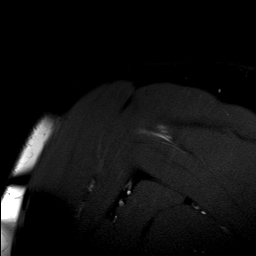
[im 4/21]
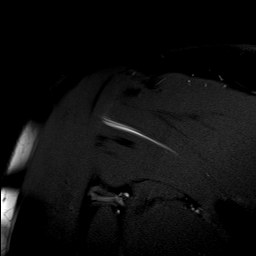
[im 7/21]
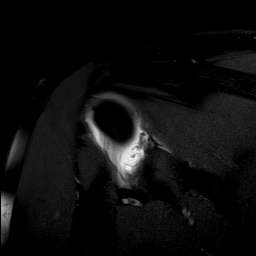
[im 11/21]
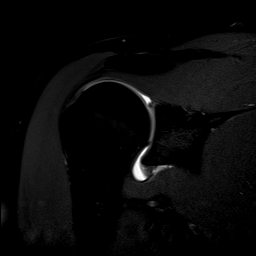
[im 14/21]
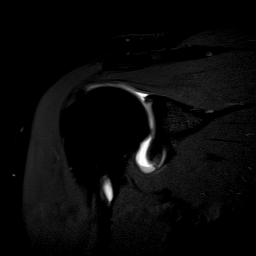
[im 17/21]
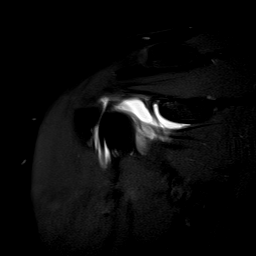
[im 21/21]
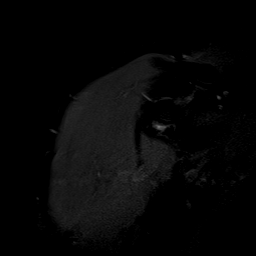

[Series 5: T2 fat-sat · oblique · 4.0mm · 0.59mm/px · 7 of 21 slices shown (1 of 2)]
[im 1/21]
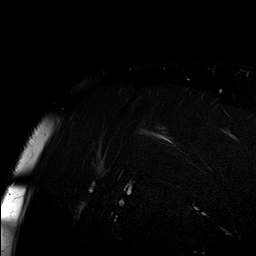
[im 4/21]
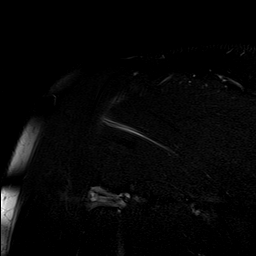
[im 7/21]
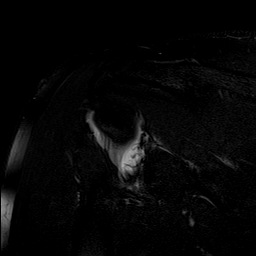
[im 11/21]
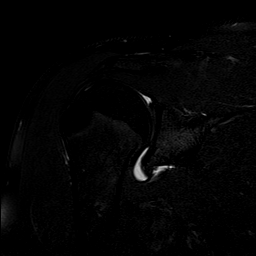
[im 14/21]
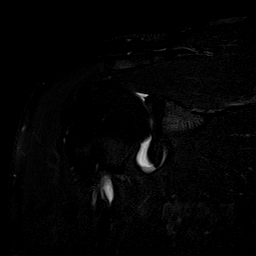
[im 17/21]
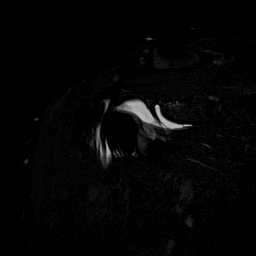
[im 21/21]
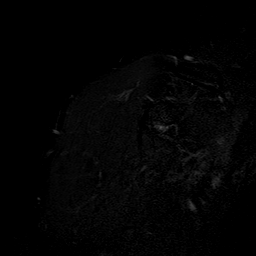

[Series 6: T1 fat-sat · oblique · non-contrast · 4.0mm · 0.47mm/px · 7 of 21 slices shown (3 of 4)]
[im 1/21]
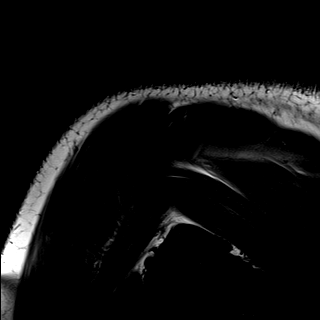
[im 4/21]
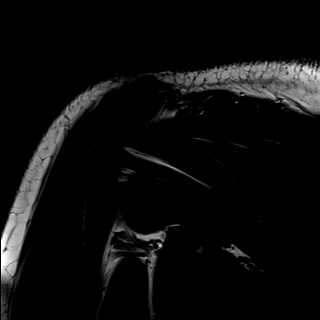
[im 7/21]
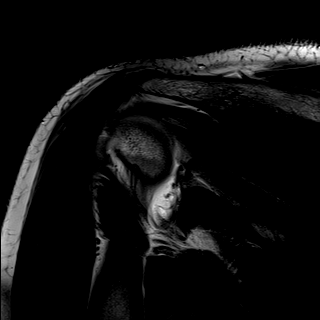
[im 11/21]
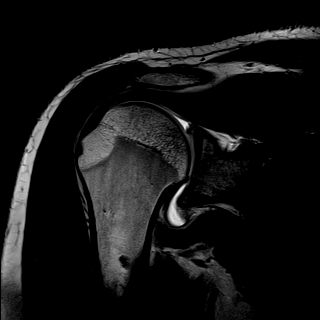
[im 14/21]
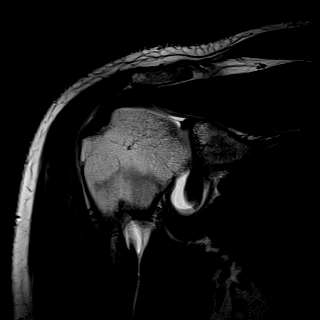
[im 17/21]
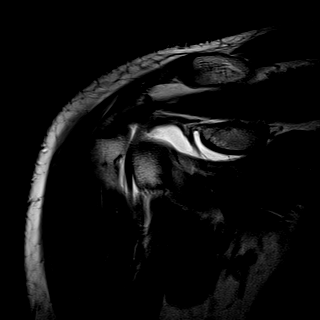
[im 21/21]
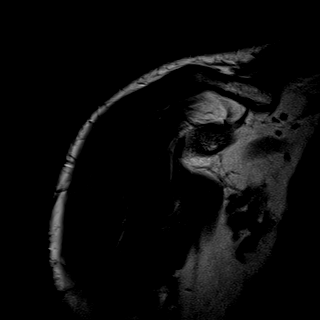

[Series 7: T2 fat-sat · oblique · 4.0mm · 0.59mm/px · 7 of 20 slices shown (2 of 2)]
[im 1/20]
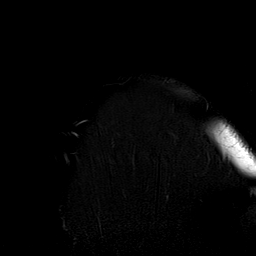
[im 4/20]
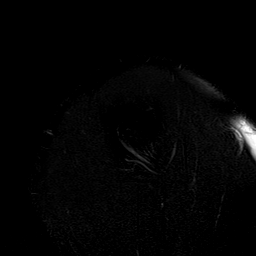
[im 7/20]
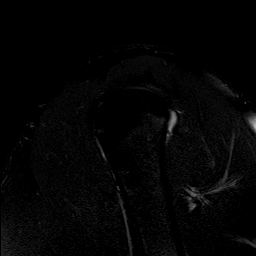
[im 10/20]
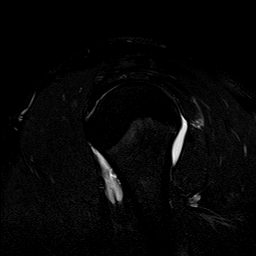
[im 13/20]
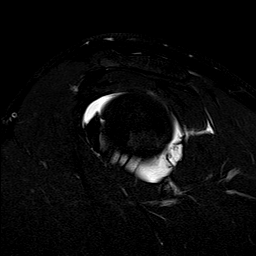
[im 16/20]
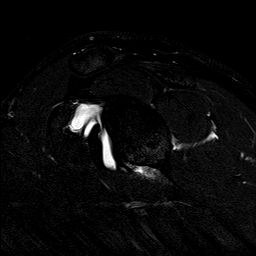
[im 20/20]
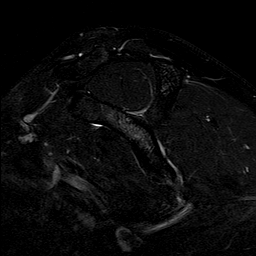

[Series 10: T1 fat-sat · sagittal · 4.0mm · 0.55mm/px · 6 of 18 slices shown (4 of 4)]
[im 1/18]
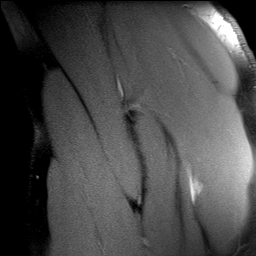
[im 4/18]
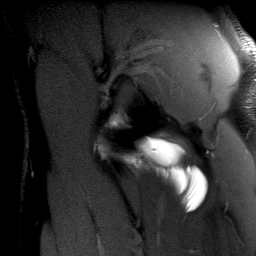
[im 7/18]
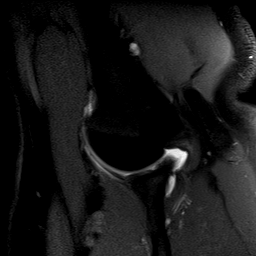
[im 11/18]
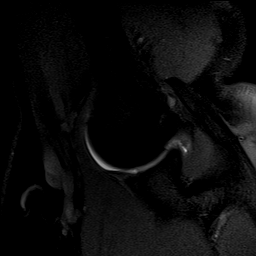
[im 14/18]
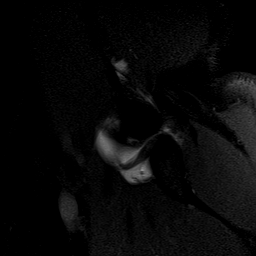
[im 18/18]
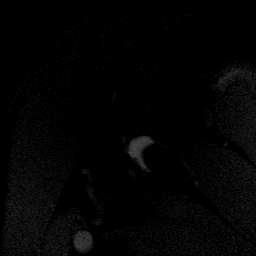

[40 of 40 positions shown; findings below may reference images not displayed]

FINDINGS: Rotator cuff: Intact and normal in appearance.

Muscles: Normal without atrophy or focal lesion.

Biceps long head: The long head of biceps tendon is no longer seen
attaching to the superior labrum likely due to a prior biceps
tenotomy or tenodesis, new since the prior exam.

Acromioclavicular Joint: Appears normal.

Glenohumeral Joint: Appears normal.

Labrum: The superior labrum is now blunted with an appearance most
compatible with interval debridement. There is some fraying of the
posterior, inferior labrum but no discrete tear is identified.

Bones: Normal without atrophy or focal lesion.
IMPRESSION: Since the prior examination, the patient has undergone biceps
tenodesis or tenotomy and debridement of the superior labrum. There
is some fraying of the posterior, inferior labrum but no discrete
labral tear is identified.

Intact and normal appearing rotator cuff.

## 2018-02-09 DIAGNOSIS — Z7251 High risk heterosexual behavior: Secondary | ICD-10-CM | POA: Diagnosis not present

## 2018-02-09 DIAGNOSIS — Z202 Contact with and (suspected) exposure to infections with a predominantly sexual mode of transmission: Secondary | ICD-10-CM | POA: Diagnosis not present

## 2018-02-28 DIAGNOSIS — Z872 Personal history of diseases of the skin and subcutaneous tissue: Secondary | ICD-10-CM | POA: Diagnosis not present

## 2018-08-02 DIAGNOSIS — A630 Anogenital (venereal) warts: Secondary | ICD-10-CM | POA: Diagnosis not present

## 2019-05-09 DIAGNOSIS — Z113 Encounter for screening for infections with a predominantly sexual mode of transmission: Secondary | ICD-10-CM | POA: Diagnosis not present

## 2019-05-09 DIAGNOSIS — R309 Painful micturition, unspecified: Secondary | ICD-10-CM | POA: Diagnosis not present

## 2019-05-09 DIAGNOSIS — Z23 Encounter for immunization: Secondary | ICD-10-CM | POA: Diagnosis not present

## 2019-06-06 DIAGNOSIS — Z23 Encounter for immunization: Secondary | ICD-10-CM | POA: Diagnosis not present
